# Patient Record
Sex: Female | Born: 1995 | Race: White | Hispanic: No | Marital: Married
Health system: Southern US, Community
[De-identification: ages and names within clinical notes are randomized; demographics above are authoritative.]

## PROBLEM LIST (undated history)

## (undated) DIAGNOSIS — F419 Anxiety disorder, unspecified: Secondary | ICD-10-CM

## (undated) DIAGNOSIS — J45909 Unspecified asthma, uncomplicated: Secondary | ICD-10-CM

## (undated) DIAGNOSIS — F410 Panic disorder [episodic paroxysmal anxiety] without agoraphobia: Secondary | ICD-10-CM

---
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
